# Patient Record
Sex: Female | Born: 1985
Health system: Midwestern US, Community
[De-identification: ages and names within clinical notes are randomized; demographics above are authoritative.]

---

## 2017-05-03 DIAGNOSIS — N809 Endometriosis, unspecified: Secondary | ICD-10-CM

## 2017-05-03 HISTORY — DX: Endometriosis, unspecified: N80.9

## 2019-01-16 ENCOUNTER — Encounter: Attending: Gynecologic Oncology

## 2019-01-16 NOTE — Progress Notes (Deleted)
[facility]  [address] [place], [place]  [place], [place]  [place], [place]  [phone] FAX [phone]  Moss Mc DO      Patient ID:  Name:  Lindsay Dawson  MRN:  [id number]  DOB:  March 06, 1986/33 y.o.  Date:  01/15/2019      HISTORY OF PRESENT ILLNESS:  Lindsay Dawson is a 33 y.o. G{NUMBERS 1-10:19289}P{NUMBERS 1-10:19289} {race/ethnicity:17218} {Menopause:31378} female referred by *** for ***.      Labs:  ***      Imaging  ***       ROS:   As above      There are no active problems to display for this patient.    No past medical history on file.   No past surgical history on file.   OB History    No obstetric history on file.       Social History     Tobacco Use   ??? Smoking status: Not on file   Substance Use Topics   ??? Alcohol use: Not on file      No family history on file.   No current outpatient medications on file.     No current facility-administered medications for this visit.      Allergies not on file       OBJECTIVE:    Physical Exam  VITAL SIGNS: There were no vitals taken for this visit.   GENERAL APP: in no apparent distress and well developed and well nourished   MUSCULOSKEL: no joint tenderness, deformity or swelling   INTEGUMENT:  warm and dry, no rashes or lesions   ABDOMEN .soft, NT, ND, No masses appreciated   EXTREMITIES: extremities normal, atraumatic, no cyanosis or edema   PELVIC: {Exam; pelvic:30843}   RECTAL: deferred   NODAL SURVEY: Cervical, supraclavicular, axillary and inguinal nodes normal.   NEURO: Grossly normal         IMPRESSION/PLAN:      The total time spent was *** minutes regarding this patients diagnosis of *** and >50% of this time was spent counseling and coordinating care    [facility]  9/14/20207:39 AM

## 2020-05-27 ENCOUNTER — Encounter: Payer: Self-pay | Admitting: Advanced Practice Midwife

## 2020-08-08 LAB — OB RESULTS CONSOLE RPR: RPR: NONREACTIVE

## 2020-08-08 LAB — OB RESULTS CONSOLE VARICELLA ZOSTER ANTIBODY, IGG: Varicella: IMMUNE

## 2020-08-08 LAB — OB RESULTS CONSOLE GC/CHLAMYDIA
Chlamydia: NEGATIVE
Gonorrhea: NEGATIVE

## 2020-08-08 LAB — OB RESULTS CONSOLE HEPATITIS B SURFACE ANTIGEN: Hepatitis B Surface Ag: NEGATIVE

## 2020-08-08 LAB — OB RESULTS CONSOLE HIV ANTIBODY (ROUTINE TESTING): HIV: NONREACTIVE

## 2020-08-08 LAB — OB RESULTS CONSOLE RUBELLA ANTIBODY, IGM: Rubella: IMMUNE

## 2020-10-27 ENCOUNTER — Other Ambulatory Visit: Payer: Self-pay | Admitting: Obstetrics & Gynecology

## 2020-10-29 ENCOUNTER — Other Ambulatory Visit: Payer: Self-pay | Admitting: Obstetrics & Gynecology

## 2020-10-29 DIAGNOSIS — K8020 Calculus of gallbladder without cholecystitis without obstruction: Secondary | ICD-10-CM

## 2020-11-05 ENCOUNTER — Ambulatory Visit
Admission: RE | Admit: 2020-11-05 | Discharge: 2020-11-05 | Disposition: A | Discharge status: Home / Self Care | Payer: BC Managed Care – PPO | Source: Ambulatory Visit | Attending: Obstetrics & Gynecology | Admitting: Obstetrics & Gynecology

## 2020-11-05 DIAGNOSIS — K8020 Calculus of gallbladder without cholecystitis without obstruction: Secondary | ICD-10-CM

## 2020-12-18 ENCOUNTER — Ambulatory Visit: Payer: BC Managed Care – PPO | Admitting: Registered"

## 2020-12-18 ENCOUNTER — Other Ambulatory Visit: Payer: Self-pay

## 2020-12-18 ENCOUNTER — Encounter: Payer: BC Managed Care – PPO | Attending: Obstetrics & Gynecology | Admitting: Registered"

## 2020-12-18 DIAGNOSIS — Z3A00 Weeks of gestation of pregnancy not specified: Secondary | ICD-10-CM | POA: Diagnosis not present

## 2020-12-18 DIAGNOSIS — O99810 Abnormal glucose complicating pregnancy: Secondary | ICD-10-CM | POA: Insufficient documentation

## 2020-12-18 DIAGNOSIS — O24419 Gestational diabetes mellitus in pregnancy, unspecified control: Secondary | ICD-10-CM

## 2020-12-18 NOTE — Progress Notes (Signed)
Patient was seen for Gestational Diabetes self-management on 12/18/2020  Start time 0830 and End time 0915   Estimated due date: 02/27/2021; [redacted]w[redacted]d  Clinical: Medications: none Supplements: CoQ10, Iron, prenatal vitamin, B6 Medical History: no previous GDM Labs: OGTT elevated, A1c unknown  Dietary and Lifestyle History: Patient states she wakes up around 8:30 and eats a little food, but no carbs and around 10 am her husband makes breakfast for her that she will eat while working and make take an hour to finish her meal. Pt reports lunch usually just consists of rice and vegetables during the week.   Physical Activity: 5x/week, 1 1/2 - 2 hrs Stress: not assessed Sleep: "plenty"  24 hr Recall: First Meal: eggs, carrots, nuts then 90 min later cereal, milk, apple Snack: Second meal: rice, vegetables, meat on weekends Snack: 2 clementine oranges Third meal: chipate bread, vegetables, egg, 8 oz buttermilk Snack: Beverages: water >100 oz  NUTRITION INTERVENTION  Nutrition education (E-1) on the following topics:   Initial Follow-up  [x]  []  Definition of Gestational Diabetes [x]  []  Why dietary management is important in controlling blood glucose [x]  []  Effects each nutrient has on blood glucose levels [x]  []  Simple carbohydrates vs complex carbohydrates [x]  []  Fluid intake [x]  []  Creating a balanced meal plan [x]  []  Carbohydrate counting  [x]  []  When to check blood glucose levels [x]  []  Proper blood glucose monitoring techniques [x]  []  Effect of stress and stress reduction techniques  [x]  []  Exercise effect on blood glucose levels, appropriate exercise during pregnancy []  []  Importance of limiting caffeine and abstaining from alcohol and smoking []  []  Medications used for blood sugar control during pregnancy []  []  Hypoglycemia and rule of 15 [x]  []  Postpartum self care  Patient already has a meter, is testing pre breakfast and 2 hours after each meal.  Patient instructed to  monitor glucose levels: FBS: 60 - ? 95 mg/dL (some clinics use 90 for cutoff) 1 hour: ? 140 mg/dL 2 hour: ? mg/dL  Patient received handouts: Nutrition Diabetes and Pregnancy Carbohydrate Counting List  Patient will be seen for follow-up as needed.

## 2020-12-22 DIAGNOSIS — O24419 Gestational diabetes mellitus in pregnancy, unspecified control: Secondary | ICD-10-CM | POA: Insufficient documentation

## 2021-02-04 LAB — OB RESULTS CONSOLE GBS: GBS: NEGATIVE

## 2021-02-17 ENCOUNTER — Encounter (HOSPITAL_COMMUNITY): Payer: Self-pay | Admitting: *Deleted

## 2021-02-18 ENCOUNTER — Other Ambulatory Visit: Payer: Self-pay | Admitting: Obstetrics & Gynecology

## 2021-02-18 NOTE — H&P (Addendum)
Paula Herring is a 35 y.o. 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  28 wk Glucola 236- A2GDM Insulin Lantus added at 31 wks due to fasting in 110s and PP 160-200s, added Glyburide w/ lunch. Diet indiscretion and limited exercise  Growth at 36 wks- LGA baby Vx, AFI 19 cm, BPP 8/8 , 7'9" 90%, AC 93% BPD 93%   Severe PUPPP at 32 wks, needing steroid pack at around 34 wks., failed other treatment.   OB History     Gravida  1   Para      Term      Preterm      AB      Living         SAB      IAB      Ectopic      Multiple      Live Births             No past medical history on file.  Family History: family history is not on file. Social History:  has no history on file for tobacco use, alcohol use, and drug use.     Maternal Diabetes: Yes:  Diabetes Type:  Insulin/Medication controlled Genetic Screening: Normal NIPT and AFP1 Maternal Ultrasounds/Referrals: Normal Fetal Ultrasounds or other Referrals:  None Maternal Substance Abuse:  No Significant Maternal Medications:  Meds include: Other:  Lantus insulin, Glyburide added at 34 wks. Prednisone 10 day taper for severe PUPPP Significant Maternal Lab Results:  Group B Strep negative Other Comments:  None  Review of Systems History   There were no vitals taken for this visit. Exam Physical Exam  Physical exam:  A&O x 3, no acute distress. Pleasant Generalized rash but healing from severe PUPPP HEENT neg, no thyromegaly Lungs CTA bilat CV RRR, S1S2 normal Abdo soft, non tender, non acute Extr no edema/ tenderness Pelvic per RN FHT  130s + accels no decels mod variability- cat I  Toco q 5-6 min  Prenatal labs: ABO, Rh:  B+ Antibody:  Neg Rubella: Immune (04/08 0000) RPR: Nonreactive (04/08 0000)  HBsAg: Negative (04/08 0000)  HIV: Non-reactive (04/08 0000)  GBS:  Negative/-- (10/05 0000)  NIPT nl AFP1 nl Glucola 236  Assessment/Plan: 35 yo G1 at 39 wks for IOL for A2GDM w/ poor control w/ macrosomia  High unengaged station, borderline pelvis Plan Cytotec- PO okay since vag exam may be difficult, #2 cytotec placed at 5.30 AM EFW 8.1/2 lbs, MOD guarded  A2GDM BS q 4 now and q 2 hrs in active labor   Spoke w/ RN- saline lock, ambulate, regular diet, shower and reassess at 9.30 am for pitocin. CNM may attempt cervical balloon.   Robley Fries

## 2021-02-19 ENCOUNTER — Encounter (HOSPITAL_COMMUNITY): Payer: Self-pay | Admitting: Obstetrics & Gynecology

## 2021-02-19 ENCOUNTER — Inpatient Hospital Stay (HOSPITAL_COMMUNITY): Payer: BC Managed Care – PPO | Admitting: Anesthesiology

## 2021-02-19 ENCOUNTER — Inpatient Hospital Stay (HOSPITAL_COMMUNITY)
Admission: AD | Admit: 2021-02-19 | Discharge: 2021-02-22 | DRG: 788 | Disposition: A | Discharge status: Home / Self Care | Payer: BC Managed Care – PPO | Attending: Obstetrics & Gynecology | Admitting: Obstetrics & Gynecology

## 2021-02-19 ENCOUNTER — Inpatient Hospital Stay (HOSPITAL_COMMUNITY): Payer: BC Managed Care – PPO

## 2021-02-19 ENCOUNTER — Other Ambulatory Visit: Payer: Self-pay

## 2021-02-19 DIAGNOSIS — O99892 Other specified diseases and conditions complicating childbirth: Secondary | ICD-10-CM | POA: Diagnosis present

## 2021-02-19 DIAGNOSIS — Z20822 Contact with and (suspected) exposure to covid-19: Secondary | ICD-10-CM | POA: Diagnosis present

## 2021-02-19 DIAGNOSIS — O3663X0 Maternal care for excessive fetal growth, third trimester, not applicable or unspecified: Secondary | ICD-10-CM | POA: Diagnosis present

## 2021-02-19 DIAGNOSIS — O2686 Pruritic urticarial papules and plaques of pregnancy (PUPPP): Secondary | ICD-10-CM | POA: Diagnosis present

## 2021-02-19 DIAGNOSIS — O339 Maternal care for disproportion, unspecified: Secondary | ICD-10-CM | POA: Diagnosis present

## 2021-02-19 DIAGNOSIS — Z3A39 39 weeks gestation of pregnancy: Secondary | ICD-10-CM

## 2021-02-19 DIAGNOSIS — Z3490 Encounter for supervision of normal pregnancy, unspecified, unspecified trimester: Secondary | ICD-10-CM | POA: Diagnosis present

## 2021-02-19 DIAGNOSIS — O24424 Gestational diabetes mellitus in childbirth, insulin controlled: Principal | ICD-10-CM | POA: Diagnosis present

## 2021-02-19 DIAGNOSIS — O24419 Gestational diabetes mellitus in pregnancy, unspecified control: Secondary | ICD-10-CM

## 2021-02-19 LAB — CBC
HCT: 37.1 % (ref 36.0–46.0)
Hemoglobin: 12.2 g/dL (ref 12.0–15.0)
MCH: 28.4 pg (ref 26.0–34.0)
MCHC: 32.9 g/dL (ref 30.0–36.0)
MCV: 86.5 fL (ref 80.0–100.0)
Platelets: 217 10*3/uL (ref 150–400)
RBC: 4.29 MIL/uL (ref 3.87–5.11)
RDW: 15.4 % (ref 11.5–15.5)
WBC: 8.5 10*3/uL (ref 4.0–10.5)
nRBC: 0 % (ref 0.0–0.2)

## 2021-02-19 LAB — GLUCOSE, CAPILLARY
Glucose-Capillary: 109 mg/dL — ABNORMAL HIGH (ref 70–99)
Glucose-Capillary: 87 mg/dL (ref 70–99)
Glucose-Capillary: 118 mg/dL — ABNORMAL HIGH (ref 70–99)
Glucose-Capillary: 72 mg/dL (ref 70–99)
Glucose-Capillary: 74 mg/dL (ref 70–99)
Glucose-Capillary: 81 mg/dL (ref 70–99)

## 2021-02-19 LAB — TYPE AND SCREEN
ABO/RH(D): B POS
Antibody Screen: NEGATIVE

## 2021-02-19 LAB — RPR: RPR Ser Ql: NONREACTIVE

## 2021-02-19 LAB — RESP PANEL BY RT-PCR (FLU A&B, COVID) ARPGX2
Influenza A by PCR: NEGATIVE
Influenza B by PCR: NEGATIVE
SARS Coronavirus 2 by RT PCR: NEGATIVE

## 2021-02-19 MED ORDER — MISOPROSTOL 25 MCG QUARTER TABLET
25.0000 ug | ORAL_TABLET | ORAL | Status: DC | PRN
  Administered 2021-02-19 (×2): 25 ug via VAGINAL
  Filled 2021-02-19 (×2): qty 1

## 2021-02-19 MED ORDER — LACTATED RINGERS IV SOLN
500.0000 mL | Freq: Once | INTRAVENOUS | Status: AC
  Administered 2021-02-19: 500 mL via INTRAVENOUS

## 2021-02-19 MED ORDER — FENTANYL CITRATE (PF) 100 MCG/2ML IJ SOLN
100.0000 ug | INTRAMUSCULAR | Status: DC | PRN
  Administered 2021-02-19 (×2): 100 ug via INTRAVENOUS
  Filled 2021-02-19 (×2): qty 2

## 2021-02-19 MED ORDER — TERBUTALINE SULFATE 1 MG/ML IJ SOLN: 0.2500 mg | Freq: Once | INTRAMUSCULAR | Status: DC | PRN

## 2021-02-19 MED ORDER — EPHEDRINE 5 MG/ML INJ: 10.0000 mg | INTRAVENOUS | Status: DC | PRN

## 2021-02-19 MED ORDER — OXYTOCIN-SODIUM CHLORIDE 30-0.9 UT/500ML-% IV SOLN
1.0000 m[IU]/min | INTRAVENOUS | Status: DC
Start: 2021-02-19 — End: 2021-02-20
  Administered 2021-02-19: 2 m[IU]/min via INTRAVENOUS
  Filled 2021-02-19: qty 500

## 2021-02-19 MED ORDER — LACTATED RINGERS IV SOLN: 500.0000 mL | INTRAVENOUS | Status: DC | PRN

## 2021-02-19 MED ORDER — LIDOCAINE HCL (PF) 1 % IJ SOLN: 30.0000 mL | INTRAMUSCULAR | Status: DC | PRN

## 2021-02-19 MED ORDER — LIDOCAINE HCL (PF) 1 % IJ SOLN
INTRAMUSCULAR | Status: DC | PRN
  Administered 2021-02-19: 2 mL via EPIDURAL
  Administered 2021-02-19: 5 mL via EPIDURAL
  Administered 2021-02-19: 3 mL via EPIDURAL

## 2021-02-19 MED ORDER — LACTATED RINGERS IV SOLN: 500.0000 mL | Freq: Once | INTRAVENOUS | Status: DC

## 2021-02-19 MED ORDER — ONDANSETRON HCL 4 MG/2ML IJ SOLN: 4.0000 mg | Freq: Four times a day (QID) | INTRAMUSCULAR | Status: DC | PRN

## 2021-02-19 MED ORDER — ACETAMINOPHEN 325 MG PO TABS: 650.0000 mg | ORAL_TABLET | ORAL | Status: DC | PRN

## 2021-02-19 MED ORDER — DIPHENHYDRAMINE HCL 50 MG/ML IJ SOLN: 12.5000 mg | INTRAMUSCULAR | Status: DC | PRN

## 2021-02-19 MED ORDER — OXYTOCIN BOLUS FROM INFUSION: 333.0000 mL | Freq: Once | INTRAVENOUS | Status: DC

## 2021-02-19 MED ORDER — LACTATED RINGERS IV SOLN: INTRAVENOUS | Status: DC

## 2021-02-19 MED ORDER — FENTANYL-BUPIVACAINE-NACL 0.5-0.125-0.9 MG/250ML-% EP SOLN
12.0000 mL/h | EPIDURAL | Status: DC | PRN
  Administered 2021-02-19: 12 mL/h via EPIDURAL
  Filled 2021-02-19: qty 250

## 2021-02-19 MED ORDER — PHENYLEPHRINE 40 MCG/ML (10ML) SYRINGE FOR IV PUSH (FOR BLOOD PRESSURE SUPPORT): 80.0000 ug | PREFILLED_SYRINGE | INTRAVENOUS | Status: DC | PRN

## 2021-02-19 MED ORDER — PHENYLEPHRINE 40 MCG/ML (10ML) SYRINGE FOR IV PUSH (FOR BLOOD PRESSURE SUPPORT)
80.0000 ug | PREFILLED_SYRINGE | INTRAVENOUS | Status: DC | PRN
  Filled 2021-02-19: qty 10

## 2021-02-19 MED ORDER — SOD CITRATE-CITRIC ACID 500-334 MG/5ML PO SOLN
30.0000 mL | ORAL | Status: DC | PRN
  Administered 2021-02-20: 30 mL via ORAL
  Filled 2021-02-19: qty 30

## 2021-02-19 MED ORDER — OXYTOCIN-SODIUM CHLORIDE 30-0.9 UT/500ML-% IV SOLN: 2.5000 [IU]/h | INTRAVENOUS | Status: DC

## 2021-02-19 NOTE — Progress Notes (Signed)
Paula Herring is a 35 y.o. G1P0 at [redacted]w[redacted]d by ultrasound admitted for induction of labor due to Gestational diabetes-poor control. Severe PUPPP in pregnancy needing oral steroid taper   Subjective: Epidural working well   Objective: BP (!) 89/71   Pulse 94   Temp 97.9 F (36.6 C) (Oral)   Resp 18   Ht 5\' 6"  (1.676 m)   Wt 90 kg   SpO2 99%   BMI 32.04 kg/m  FHR: 140 bpm, variability: moderate,  accelerations:  Present,  decelerations:  Absent UC:   regular, every 3 minutes SVE:   Dilation: 4.5 Effacement (%): 100 Station: -3 Exam by:: Dr. [email or web address] Small caput noted  Assessment / Plan: Induction of labor due to gestational diabetes,  progressing well on pitocin  Labor: Progressing normally but no descent. S/p Cytotec x 2, then cervical balloon, AROM and now on pitocin Preeclampsia:   NA Fetal Wellbeing:  Category I Pain Control:  Epidural I/D:  n/a Anticipated MOD:   Guarded. Continue to turn pt and assess if aids descent. High stn and borderline pelvis are of concern and risk fo C/s   A2GDM- Lantus last night, good control at present PUPPP stable, anticipate improvement pp   Juliene Pina 02/19/2021, 9:00 PM

## 2021-02-19 NOTE — Progress Notes (Signed)
RN entered patient's room to apply FHR monitors per intermittent monitoring order. Patient in the shower. Patient told to call RN when out of the shower so FHR monitors could be applied.

## 2021-02-19 NOTE — Anesthesia Preprocedure Evaluation (Addendum)
Anesthesia Evaluation  Patient identified by MRN, date of birth, ID band Patient awake    Reviewed: Allergy & Precautions, Patient's Chart, lab work & pertinent test results  Airway Mallampati: II  TM Distance: >3 FB Neck ROM: Full    Dental   Pulmonary neg pulmonary ROS,    breath sounds clear to auscultation       Cardiovascular negative cardio ROS   Rhythm:Regular Rate:Normal     Neuro/Psych negative neurological ROS     GI/Hepatic negative GI ROS, Neg liver ROS,   Endo/Other  diabetes, Gestational, Insulin Dependent  Renal/GU negative Renal ROS     Musculoskeletal   Abdominal   Peds  Hematology negative hematology ROS (+)   Anesthesia Other Findings   Reproductive/Obstetrics (+) Pregnancy                             Lab Results  Component Value Date   WBC 8.5 02/19/2021   HGB 12.2 02/19/2021   HCT 37.1 02/19/2021   MCV 86.5 02/19/2021   PLT 217 02/19/2021   No results found for: CREATININE, BUN, NA, K, CL, CO2  Anesthesia Physical Anesthesia Plan  ASA: 2  Anesthesia Plan: Epidural   Post-op Pain Management:    Induction:   PONV Risk Score and Plan: 2 and Treatment may vary due to age or medical condition  Airway Management Planned: Natural Airway  Additional Equipment:   Intra-op Plan:   Post-operative Plan:   Informed Consent: I have reviewed the patients History and Physical, chart, labs and discussed the procedure including the risks, benefits and alternatives for the proposed anesthesia with the patient or authorized representative who has indicated his/her understanding and acceptance.       Plan Discussed with:   Anesthesia Plan Comments: (UPDATE 02/20/21 1:22 AM: Patient to go to OR for C section due to arrest of descent. Labor epidural in place and functioning well. Will plan to use epidural for surgical anesthesia. Discussed with patient. )        Anesthesia Quick Evaluation

## 2021-02-19 NOTE — Progress Notes (Signed)
Paula Herring is a 35 y.o. G1P0 at [redacted]w[redacted]d by ultrasound admitted for  IOL / GDM A2, macrosomia  Subjective: MD request for cervical balloon placement, s/p vaginal cytotec overnight for ripening.  Patient walking in room, spouse attentive at [facility]. R/B of procedure discussed and patient consents.   Objective: Vitals:   02/19/21 0028 02/19/21 0123 02/19/21 0530 02/19/21 0731  BP: 102/60 112/65 112/66 (!) 86/50  Pulse: 82 76 60 (!) 57  Resp: 16 19 17 16   Temp: (!) 97.4 F (36.3 C)  98.2 F (36.8 C) 98.3 F (36.8 C)  TempSrc: Oral  Oral Oral  Weight: 90 kg     Height: 5\' 6"  (1.676 m)         FHT:  FHR: 130 bpm, variability: moderate,  accelerations:  Present,  decelerations:  Absent UC:   regular, every 2-4 minutes SVE:   Dilation: 2.5 Effacement (%): 20 Station: -3 Exam by:: D. Renisha Cockrum CNM Vertex, IBOW Cervical balloon placed without difficulty, patient coping well with procedure Balloon inflated with 60 cc fluid and traction to leg applied.   Labs:   Recent Labs    02/19/21 0037  WBC 8.5  HGB 12.2  HCT 37.1  PLT 217  CBG (last 3)  Recent Labs    02/19/21 0152 02/19/21 0534  GLUCAP 109* 87     Assessment / Plan: G1P0 35 y.o. [redacted]w[redacted]d G1P0 at [redacted]w[redacted]d for IOL GDM A2 - stable CBG in labor Macrosomia  Labor:  S/P vaginal cytotec x 2, now with IF placed Preeclampsia:  no signs or symptoms of toxicity Fetal Wellbeing:   Category I Pain Control:  Labor support without medications I/D:   GBS neg Anticipated  MOD:   guarded for borderline pelvis and fetal macrosomia/diabetic  Dr. Juliene Pina updated w/ patient status  Paula Herring, CNM, MSN 02/19/2021, 8:48 AM

## 2021-02-19 NOTE — Progress Notes (Signed)
Paula Herring is a 35 y.o. G1P0 at [redacted]w[redacted]d by ultrasound admitted for induction of labor due to Gestational diabetes-poor control. Severe PUPPP in pregnancy needing oral steroid taper   Subjective: Epidural working well   Objective: BP (!) 100/59   Pulse 70   Temp 98 F (36.7 C) (Oral)   Resp 17   Ht 5\' 6"  (1.676 m)   Wt 90 kg   SpO2 99%   BMI 32.04 kg/m  Abdo exam note head in almost left suprapubic area  FHT:  FHR: 140 bpm, variability: moderate,  accelerations:  Present,  decelerations:  Absent UC:   regular, every 3 minutes SVE:   Dilation: 5 Effacement (%): 80 Station:  (-4 per Dr. ) Exam by:: Dr. [email or web address] Controlled AROM with RN giving fundal pressure. Clear AF. Head towards pt's left upper pelvis above the inlet, not in pelvis but applied to cervix. No cord palpated   Labs: Lab Results  Component Value Date   WBC 8.5 02/19/2021   HGB 12.2 02/19/2021   HCT 37.1 02/19/2021   MCV 86.5 02/19/2021   PLT 217 02/19/2021    Assessment / Plan: Induction of labor due to gestational diabetes,  progressing well on pitocin  Labor: Progressing normally but no descent. S/p Cytotec x 2, then cervical balloon, contracting well, now s/p AROM Preeclampsia:   NA Fetal Wellbeing:  Category I Pain Control:  Epidural I/D:  n/a Anticipated MOD:   Guarded. Exaggerated Sims, see if head engages, otherwise will need C/s, pt aware and accepts   A2GDM- Lantus last night, good control at present PUPPP stable, anticipate improvement pp   02/21/2021 02/19/2021, 2:48 PM

## 2021-02-19 NOTE — Anesthesia Procedure Notes (Signed)
Epidural Patient location during procedure: OB Start time: 02/19/2021 10:08 AM End time: 02/19/2021 10:15 AM  Staffing Anesthesiologist: Marcene Duos, MD Performed: anesthesiologist   Preanesthetic Checklist Completed: patient identified, IV checked, site marked, risks and benefits discussed, surgical consent, monitors and equipment checked, pre-op evaluation and timeout performed  Epidural Patient position: sitting Prep: DuraPrep and site prepped and draped Patient monitoring: continuous pulse ox and blood pressure Approach: midline Location: L3-L4 Injection technique: LOR air  Needle:  Needle type: Tuohy  Needle gauge: 17 G Needle length: 9 cm and 9 Needle insertion depth: 6 cm Catheter type: closed end flexible Catheter size: 19 Gauge Catheter at skin depth: 11 cm Test dose: negative  Assessment Events: blood not aspirated, injection not painful, no injection resistance, no paresthesia and negative IV test

## 2021-02-20 ENCOUNTER — Encounter (HOSPITAL_COMMUNITY)
Admission: AD | Disposition: A | Discharge status: Home / Self Care | Payer: Self-pay | Source: Home / Self Care | Attending: Obstetrics & Gynecology

## 2021-02-20 ENCOUNTER — Encounter (HOSPITAL_COMMUNITY): Payer: Self-pay | Admitting: Obstetrics & Gynecology

## 2021-02-20 DIAGNOSIS — O2686 Pruritic urticarial papules and plaques of pregnancy (PUPPP): Secondary | ICD-10-CM

## 2021-02-20 DIAGNOSIS — O99892 Other specified diseases and conditions complicating childbirth: Secondary | ICD-10-CM | POA: Diagnosis present

## 2021-02-20 HISTORY — DX: Pruritic urticarial papules and plaques of pregnancy (puppp): O26.86

## 2021-02-20 LAB — GLUCOSE, CAPILLARY
Glucose-Capillary: 108 mg/dL — ABNORMAL HIGH (ref 70–99)
Glucose-Capillary: 142 mg/dL — ABNORMAL HIGH (ref 70–99)
Glucose-Capillary: 155 mg/dL — ABNORMAL HIGH (ref 70–99)

## 2021-02-20 LAB — CBC
HCT: 35.7 % — ABNORMAL LOW (ref 36.0–46.0)
Hemoglobin: 11.9 g/dL — ABNORMAL LOW (ref 12.0–15.0)
MCH: 28.7 pg (ref 26.0–34.0)
MCHC: 33.3 g/dL (ref 30.0–36.0)
MCV: 86.2 fL (ref 80.0–100.0)
Platelets: 183 10*3/uL (ref 150–400)
RBC: 4.14 MIL/uL (ref 3.87–5.11)
RDW: 15.4 % (ref 11.5–15.5)
WBC: 16.5 10*3/uL — ABNORMAL HIGH (ref 4.0–10.5)
nRBC: 0 % (ref 0.0–0.2)

## 2021-02-20 SURGERY — Surgical Case
Anesthesia: Epidural

## 2021-02-20 MED ORDER — FENTANYL CITRATE (PF) 100 MCG/2ML IJ SOLN
INTRAMUSCULAR | Status: DC | PRN
  Administered 2021-02-20: 100 ug via EPIDURAL

## 2021-02-20 MED ORDER — NALOXONE HCL 4 MG/10ML IJ SOLN
1.0000 ug/kg/h | INTRAVENOUS | Status: DC | PRN
  Filled 2021-02-20: qty 5

## 2021-02-20 MED ORDER — KETOROLAC TROMETHAMINE 30 MG/ML IJ SOLN
30.0000 mg | Freq: Once | INTRAMUSCULAR | Status: AC
  Administered 2021-02-20: 30 mg via INTRAVENOUS

## 2021-02-20 MED ORDER — WITCH HAZEL-GLYCERIN EX PADS: 1.0000 "application " | MEDICATED_PAD | CUTANEOUS | Status: DC | PRN

## 2021-02-20 MED ORDER — NALBUPHINE HCL 10 MG/ML IJ SOLN: 5.0000 mg | INTRAMUSCULAR | Status: DC | PRN

## 2021-02-20 MED ORDER — MENTHOL 3 MG MT LOZG: 1.0000 | LOZENGE | OROMUCOSAL | Status: DC | PRN

## 2021-02-20 MED ORDER — KETOROLAC TROMETHAMINE 30 MG/ML IJ SOLN
INTRAMUSCULAR | Status: AC
  Filled 2021-02-20: qty 1

## 2021-02-20 MED ORDER — PRENATAL MULTIVITAMIN CH
1.0000 | ORAL_TABLET | Freq: Every day | ORAL | Status: DC
  Administered 2021-02-20 – 2021-02-22 (×3): 1 via ORAL
  Filled 2021-02-20 (×3): qty 1

## 2021-02-20 MED ORDER — CEFAZOLIN SODIUM-DEXTROSE 2-4 GM/100ML-% IV SOLN
INTRAVENOUS | Status: AC
  Filled 2021-02-20: qty 100

## 2021-02-20 MED ORDER — KETOROLAC TROMETHAMINE 30 MG/ML IJ SOLN: 30.0000 mg | Freq: Four times a day (QID) | INTRAMUSCULAR | Status: AC | PRN

## 2021-02-20 MED ORDER — LIDOCAINE-EPINEPHRINE (PF) 2 %-1:200000 IJ SOLN
INTRAMUSCULAR | Status: DC | PRN
  Administered 2021-02-20: 2 mL via EPIDURAL
  Administered 2021-02-20: 5 mL via EPIDURAL

## 2021-02-20 MED ORDER — OXYTOCIN-SODIUM CHLORIDE 30-0.9 UT/500ML-% IV SOLN
INTRAVENOUS | Status: AC
  Filled 2021-02-20: qty 500

## 2021-02-20 MED ORDER — COCONUT OIL OIL: 1.0000 "application " | TOPICAL_OIL | Status: DC | PRN

## 2021-02-20 MED ORDER — OXYCODONE HCL 5 MG PO TABS
5.0000 mg | ORAL_TABLET | Freq: Four times a day (QID) | ORAL | Status: DC | PRN
  Administered 2021-02-21 – 2021-02-22 (×4): 5 mg via ORAL
  Filled 2021-02-20 (×5): qty 1

## 2021-02-20 MED ORDER — LACTATED RINGERS IV SOLN: INTRAVENOUS | Status: DC | PRN

## 2021-02-20 MED ORDER — ZOLPIDEM TARTRATE 5 MG PO TABS: 5.0000 mg | ORAL_TABLET | Freq: Every evening | ORAL | Status: DC | PRN

## 2021-02-20 MED ORDER — NALBUPHINE HCL 10 MG/ML IJ SOLN: 5.0000 mg | Freq: Once | INTRAMUSCULAR | Status: DC | PRN

## 2021-02-20 MED ORDER — TETANUS-DIPHTH-ACELL PERTUSSIS 5-2.5-18.5 LF-MCG/0.5 IM SUSY: 0.5000 mL | PREFILLED_SYRINGE | Freq: Once | INTRAMUSCULAR | Status: DC

## 2021-02-20 MED ORDER — DEXAMETHASONE SODIUM PHOSPHATE 10 MG/ML IJ SOLN
INTRAMUSCULAR | Status: AC
  Filled 2021-02-20: qty 1

## 2021-02-20 MED ORDER — NALBUPHINE HCL 10 MG/ML IJ SOLN
5.0000 mg | INTRAMUSCULAR | Status: DC | PRN
Start: 2021-02-20 — End: 2021-02-22

## 2021-02-20 MED ORDER — SIMETHICONE 80 MG PO CHEW
80.0000 mg | CHEWABLE_TABLET | Freq: Three times a day (TID) | ORAL | Status: DC
  Administered 2021-02-20 – 2021-02-22 (×6): 80 mg via ORAL
  Filled 2021-02-20 (×6): qty 1

## 2021-02-20 MED ORDER — ACETAMINOPHEN 325 MG PO TABS
650.0000 mg | ORAL_TABLET | ORAL | Status: DC | PRN
  Administered 2021-02-20 – 2021-02-21 (×3): 650 mg via ORAL
  Filled 2021-02-20 (×3): qty 2

## 2021-02-20 MED ORDER — POVIDONE-IODINE 10 % EX SWAB: 2.0000 "application " | Freq: Once | CUTANEOUS | Status: DC

## 2021-02-20 MED ORDER — SODIUM CHLORIDE 0.9 % IV SOLN
INTRAVENOUS | Status: AC
  Filled 2021-02-20: qty 500

## 2021-02-20 MED ORDER — ONDANSETRON HCL 4 MG/2ML IJ SOLN
INTRAMUSCULAR | Status: AC
  Filled 2021-02-20: qty 2

## 2021-02-20 MED ORDER — INFLUENZA VAC SPLIT QUAD 0.5 ML IM SUSY: 0.5000 mL | PREFILLED_SYRINGE | INTRAMUSCULAR | Status: DC

## 2021-02-20 MED ORDER — OXYTOCIN-SODIUM CHLORIDE 30-0.9 UT/500ML-% IV SOLN: 2.5000 [IU]/h | INTRAVENOUS | Status: AC

## 2021-02-20 MED ORDER — MORPHINE SULFATE (PF) 0.5 MG/ML IJ SOLN
INTRAMUSCULAR | Status: DC | PRN
  Administered 2021-02-20: 3 mg via EPIDURAL

## 2021-02-20 MED ORDER — DIBUCAINE (PERIANAL) 1 % EX OINT: 1.0000 "application " | TOPICAL_OINTMENT | CUTANEOUS | Status: DC | PRN

## 2021-02-20 MED ORDER — CEFAZOLIN SODIUM-DEXTROSE 2-4 GM/100ML-% IV SOLN
2.0000 g | INTRAVENOUS | Status: AC
  Administered 2021-02-20: 2 g via INTRAVENOUS

## 2021-02-20 MED ORDER — SIMETHICONE 80 MG PO CHEW: 80.0000 mg | CHEWABLE_TABLET | ORAL | Status: DC | PRN

## 2021-02-20 MED ORDER — FENTANYL CITRATE (PF) 100 MCG/2ML IJ SOLN
INTRAMUSCULAR | Status: AC
  Filled 2021-02-20: qty 2

## 2021-02-20 MED ORDER — NALBUPHINE HCL 10 MG/ML IJ SOLN
5.0000 mg | Freq: Once | INTRAMUSCULAR | Status: DC | PRN
Start: 2021-02-20 — End: 2021-02-22

## 2021-02-20 MED ORDER — IBUPROFEN 600 MG PO TABS
600.0000 mg | ORAL_TABLET | Freq: Four times a day (QID) | ORAL | Status: DC
  Administered 2021-02-21 – 2021-02-22 (×6): 600 mg via ORAL
  Filled 2021-02-20 (×6): qty 1

## 2021-02-20 MED ORDER — ONDANSETRON HCL 4 MG/2ML IJ SOLN: 4.0000 mg | Freq: Three times a day (TID) | INTRAMUSCULAR | Status: DC | PRN

## 2021-02-20 MED ORDER — NALOXONE HCL 0.4 MG/ML IJ SOLN: 0.4000 mg | INTRAMUSCULAR | Status: DC | PRN

## 2021-02-20 MED ORDER — DIPHENHYDRAMINE HCL 50 MG/ML IJ SOLN: 12.5000 mg | INTRAMUSCULAR | Status: DC | PRN

## 2021-02-20 MED ORDER — MORPHINE SULFATE (PF) 0.5 MG/ML IJ SOLN
INTRAMUSCULAR | Status: AC
  Filled 2021-02-20: qty 10

## 2021-02-20 MED ORDER — SODIUM CHLORIDE 0.9% FLUSH: 3.0000 mL | INTRAVENOUS | Status: DC | PRN

## 2021-02-20 MED ORDER — DIPHENHYDRAMINE HCL 25 MG PO CAPS: 25.0000 mg | ORAL_CAPSULE | ORAL | Status: DC | PRN

## 2021-02-20 MED ORDER — OXYTOCIN-SODIUM CHLORIDE 30-0.9 UT/500ML-% IV SOLN
INTRAVENOUS | Status: DC | PRN
  Administered 2021-02-20: 30 [IU] via INTRAVENOUS

## 2021-02-20 MED ORDER — STERILE WATER FOR IRRIGATION IR SOLN
Status: DC | PRN
  Administered 2021-02-20: 1000 mL

## 2021-02-20 MED ORDER — SODIUM CHLORIDE 0.9 % IR SOLN
Status: DC | PRN
  Administered 2021-02-20: 1000 mL

## 2021-02-20 MED ORDER — KETOROLAC TROMETHAMINE 30 MG/ML IJ SOLN
30.0000 mg | Freq: Four times a day (QID) | INTRAMUSCULAR | Status: AC
  Administered 2021-02-20 (×2): 30 mg via INTRAVENOUS
  Filled 2021-02-20 (×3): qty 1

## 2021-02-20 MED ORDER — SCOPOLAMINE 1 MG/3DAYS TD PT72
1.0000 | MEDICATED_PATCH | Freq: Once | TRANSDERMAL | Status: DC
  Administered 2021-02-20: 1.5 mg via TRANSDERMAL
  Filled 2021-02-20: qty 1

## 2021-02-20 MED ORDER — DIPHENHYDRAMINE HCL 25 MG PO CAPS: 25.0000 mg | ORAL_CAPSULE | Freq: Four times a day (QID) | ORAL | Status: DC | PRN

## 2021-02-20 MED ORDER — ONDANSETRON HCL 4 MG/2ML IJ SOLN
INTRAMUSCULAR | Status: DC | PRN
  Administered 2021-02-20: 4 mg via INTRAVENOUS

## 2021-02-20 MED ORDER — SENNOSIDES-DOCUSATE SODIUM 8.6-50 MG PO TABS
2.0000 | ORAL_TABLET | Freq: Every day | ORAL | Status: DC
  Administered 2021-02-21 – 2021-02-22 (×2): 2 via ORAL
  Filled 2021-02-20 (×2): qty 2

## 2021-02-20 SURGICAL SUPPLY — 33 items
BENZOIN TINCTURE PRP APPL 2/3 (GAUZE/BANDAGES/DRESSINGS) IMPLANT
CHLORAPREP W/TINT 26ML (MISCELLANEOUS) ×2 IMPLANT
CLAMP CORD UMBIL (MISCELLANEOUS) IMPLANT
CLOTH BEACON ORANGE TIMEOUT ST (SAFETY) ×2 IMPLANT
DRSG OPSITE POSTOP 4X10 (GAUZE/BANDAGES/DRESSINGS) ×2 IMPLANT
ELECT REM PT RETURN 9FT ADLT (ELECTROSURGICAL) ×2
ELECTRODE REM PT RTRN 9FT ADLT (ELECTROSURGICAL) ×1 IMPLANT
EXTRACTOR VACUUM KIWI (MISCELLANEOUS) IMPLANT
EXTRACTOR VACUUM M CUP 4 TUBE (SUCTIONS) IMPLANT
GLOVE SURG ENC MOIS LTX SZ7 (GLOVE) ×2 IMPLANT
GLOVE SURG UNDER POLY LF SZ7 (GLOVE) ×2 IMPLANT
GOWN STRL REUS W/TWL LRG LVL3 (GOWN DISPOSABLE) ×4 IMPLANT
KIT ABG SYR 3ML LUER SLIP (SYRINGE) IMPLANT
NEEDLE HYPO 25X5/8 SAFETYGLIDE (NEEDLE) IMPLANT
NS IRRIG 1000ML POUR BTL (IV SOLUTION) ×2 IMPLANT
PACK C SECTION WH (CUSTOM PROCEDURE TRAY) ×2 IMPLANT
PAD OB MATERNITY 4.3X12.25 (PERSONAL CARE ITEMS) ×2 IMPLANT
RTRCTR C-SECT PINK 25CM LRG (MISCELLANEOUS) IMPLANT
STRIP CLOSURE SKIN 1/2X4 (GAUZE/BANDAGES/DRESSINGS) IMPLANT
SUT MNCRL 0 VIOLET CTX 36 (SUTURE) ×2 IMPLANT
SUT MONOCRYL 0 CTX 36 (SUTURE) ×2
SUT PLAIN 0 NONE (SUTURE) IMPLANT
SUT PLAIN 2 0 (SUTURE) ×1
SUT PLAIN ABS 2-0 CT1 27XMFL (SUTURE) ×1 IMPLANT
SUT VIC AB 0 CT1 27 (SUTURE) ×2
SUT VIC AB 0 CT1 27XBRD ANBCTR (SUTURE) ×2 IMPLANT
SUT VIC AB 2-0 CT1 27 (SUTURE) ×1
SUT VIC AB 2-0 CT1 TAPERPNT 27 (SUTURE) ×1 IMPLANT
SUT VIC AB 4-0 KS 27 (SUTURE) ×2 IMPLANT
SUT VICRYL 0 TIES 12 18 (SUTURE) IMPLANT
TOWEL OR 17X24 6PK STRL BLUE (TOWEL DISPOSABLE) ×2 IMPLANT
TRAY FOLEY W/BAG SLVR 14FR LF (SET/KITS/TRAYS/PACK) IMPLANT
WATER STERILE IRR 1000ML POUR (IV SOLUTION) ×2 IMPLANT

## 2021-02-20 NOTE — Lactation Note (Signed)
This note was copied from a baby's chart. Lactation Consultation Note  Patient Name: Paula Herring Date: 02/20/2021 Reason for consult: Follow-up assessment;Mother's request;Difficult latch;Primapara;1st time breastfeeding;Term;Breastfeeding assistance;RN request Age:34 hours LC worked with Mom to get a better position to support the latch. Mom really tired during visit, falling asleep during visit.   Mom tried pumping with DEBP to offer more volume since not able to establish latch. She falling asleep again.   DBM consent offered option, to get infant more volume.  Plan 1. To feed based on cues 8-12x 24 hr period. Mom to offer breasts first and look for signs of milk transfer.  2. Mom to offer EBM first followed by [facility] with pace bottle feeding with a slow flow nipple.  3. Mom to pump with dEBP q 3hrs for .   Mom to call for latch assistance with next feeding.  LC shared above findings and plan with RN, Adrienne Mocha  Maternal Data Has patient been taught Hand Expression?: Yes  Feeding Mother's Current Feeding Choice: Breast Milk and Donor Milk  LATCH Score Latch: Repeated attempts needed to sustain latch, nipple held in mouth throughout feeding, stimulation needed to elicit sucking reflex.  Audible Swallowing: None  Type of Nipple: Everted at rest and after stimulation (Infant not able to open wide enough, just sucking on nipple. LC did some suck training to extend tongue. Will come back to work with mom next feeding once she gets some rest.)  Comfort (Breast/Nipple): Soft / non-tender  Hold (Positioning): Assistance needed to correctly position infant at breast and maintain latch.  LATCH Score: 6   Lactation Tools Discussed/Used Tools: Pump;Flanges Flange Size: 21;24 Breast pump type: Double-Electric Breast Pump Pump Education: Setup, frequency, and cleaning;Milk Storage Reason for Pumping: increase stimulation Pumping frequency: every 3 hrs for 15  min  Interventions Interventions: Breast feeding basics reviewed;Support pillows;Education;Assisted with latch;Position options;Pace feeding;Skin to skin;Expressed milk;Breast massage;Hand express;Infant Driven Feeding Algorithm education;Breast compression;Adjust position;DEBP  Discharge Pump: Personal  Consult Status Consult Status: Follow-up Date: 02/21/21 Follow-up type: In-patient    Hollace Michelli  Nicholson-Springer 02/20/2021, 5:55 PM

## 2021-02-20 NOTE — Progress Notes (Signed)
Contact Paula Herring CNM to clarify CBG monitoring orders. Currently ordered for post perianals but no time frame given. Per CNM verbal order will recheck patients CBG after pts breakfast and call CNM with results to determine if further CBG checks are needed moving forward.

## 2021-02-20 NOTE — Progress Notes (Signed)
Complete at 10.45 am and pusing off and on.  SVE stn 0 to +1 with no progressed, only rocking during oushing and regresses back to 0, caput moulding. Inadequate pelvis.  FHT cat II, intermittent variable decels w/ pushing down to 80s and slow recovery.  Recc C/s. Risks/complications of surgery reviewed incl infection, bleeding, damage to internal organs including bladder, bowels, ureters, blood vessels, other risks from anesthesia, VTE and delayed complications of any surgery, complications in future surgery reviewed. Also discussed neonatal complications incl difficult delivery, laceration, vacuum assistance, TTN etc. Pt understands and agrees, all concerns addressed.   Agrees  Shea Evans MD

## 2021-02-20 NOTE — Op Note (Signed)
Cesarean Section Procedure Note  Emmry Hinsch  02/20/2021  Procedure: Primary Low Transverse Cesarean section   Indications:  Arrest of descent   Induction for A2GDM. Suspect macrosomia. Severe PUPPP in third trimester. Arrest of descent in 2nd stage, pushed at 0/+1 station for 1.1/2 hours with no progress, pelvis inadequate.  Pre-operative Diagnosis: Arrest of descent / CPD. A2GDM  Post-operative Diagnosis: Same   Surgeon: Shea Evans, MD  Assistants: Arlan Organ, CNM   Anesthesia: epidural   Procedure Details:  The patient was seen in the Labor Room. The risks, benefits, complications, treatment options, and expected outcomes were discussed with the patient. The patient concurred with the proposed plan, giving informed consent. identified as Wynn Banker and the procedure verified as C-Section Delivery. A Time Out was held and the above information confirmed. 2 gm Ancef given After induction of anesthesia, the patient was draped and prepped in the usual sterile manner, foley was draining urine well.  A pfannenstiel incision was made and carried down through the subcutaneous tissue to the fascia. Fascial incision was made and extended transversely. The fascia was separated from the underlying rectus tissue superiorly and inferiorly. The peritoneum was identified and entered. Peritoneal incision was extended longitudinally. Alexis-O retractor placed. A low transverse uterine incision was made away from bladder reflection. Amniotic fluid was clear. Baby was in LOT position. Delivered from cephalic presentation was a Female infant with vigorous cry. Apgar scores of 9 at one minute and 9 at five minutes. Delayed cord clamping done at 1 minute and baby handed to NICU team in attendance. Cord ph was not sent. Cord blood was obtained for evaluation. The placenta was removed Intact and appeared normal. The uterine outline, tubes and ovaries appeared normal}. The uterine incision was closed with  running locked sutures of . A second imbricating layer sutured.   Hemostasis was observed. Alexis retractor removed. Peritoneal closure done with 2-0 Vicryl.  The fascia was then reapproximated with running sutures of 0Vicryl. The subcuticular closure was performed using 2-0plain gut. The skin was closed with 4-0Vicryl. Honeycomb, steristrips applied.  Instrument, sponge, and needle counts were correct prior the abdominal closure and were correct at the conclusion of the case.   Findings: Female infant delivered cephalic via Kerr hysterotomy from mid-pelvic arrest from LOT position. Apgars 9 and 9, clear amniotic fluid. Normal placenta, loose nuchal cord, normal tubes and ovaries.    Estimated Blood Loss: 316 cc   Total IV Fluids:  LR 1200 ml   Urine Output:  50 CC OF clear urine blood tinge resolved at the end of surgery  Specimens: cord blood    Complications: no complications  Disposition: PACU - hemodynamically stable.   Maternal Condition: stable   Baby condition / location:  Couplet care / Skin to Skin  Attending Attestation: I performed the procedure.   Signed: Surgeon(s): Shea Evans, MD

## 2021-02-20 NOTE — Anesthesia Postprocedure Evaluation (Signed)
Anesthesia Post Note  Patient: Clinical biochemist  Procedure(s) Performed: CESAREAN SECTION     Patient location during evaluation: PACU Anesthesia Type: Epidural Level of consciousness: oriented and awake and alert Pain management: pain level controlled Vital Signs Assessment: post-procedure vital signs reviewed and stable Respiratory status: spontaneous breathing, respiratory function stable and nonlabored ventilation Cardiovascular status: blood pressure returned to baseline and stable Postop Assessment: no headache, no backache, no apparent nausea or vomiting and epidural receding Anesthetic complications: no   No notable events documented.  Last Vitals:  Vitals:   02/20/21 0300 02/20/21 0348  BP: (!) 97/58 104/61  Pulse: 76 80  Resp: 17 18  Temp:  37.3 C  SpO2: 99% 97%    Last Pain:  Vitals:   02/20/21 0348  TempSrc: Oral  PainSc:    Pain Goal:                   Paula Herring

## 2021-02-20 NOTE — Lactation Note (Signed)
This note was copied from a baby's chart. Lactation Consultation Note  Patient Name: Paula Herring [id number] Date: 02/20/2021 Reason for consult: Initial assessment;Term;Primapara;1st time breastfeeding Age:34 hours   P1 mother whose infant is now 80 hours old.  This is a term baby at 39+0 weeks.  Baby asleep STS on mother's chest when I arrived.  Offered to assist with latching and mother agreeable.  Taught hand expression and mother able to easily express colostrum.  Finger fed colostrum to baby.  Assisted to latch easily and observed baby feeding with stimulation for 10 minutes.    Encouraged mother to feed 8-12 times/24 hours or sooner if baby shows feeding cues.  Mother will call for latch assistance as needed.  Father present.  Swaddled baby and placed in bassinet after feeding; family to rest now.   Maternal Data Has patient been taught Hand Expression?: Yes Does the patient have breastfeeding experience prior to this delivery?: No  Feeding Mother's Current Feeding Choice: Breast Milk  LATCH Score Latch: Repeated attempts needed to sustain latch, nipple held in mouth throughout feeding, stimulation needed to elicit sucking reflex.  Audible Swallowing: A few with stimulation  Type of Nipple: Everted at rest and after stimulation  Comfort (Breast/Nipple): Soft / non-tender  Hold (Positioning): Assistance needed to correctly position infant at breast and maintain latch.  LATCH Score: 7   Lactation Tools Discussed/Used    Interventions Interventions: Breast feeding basics reviewed;Assisted with latch;Skin to skin;Breast massage;Hand express;Breast compression;Position options;Support pillows;Adjust position;Education;LC Services brochure  Discharge    Consult Status Consult Status: Follow-up Date: 02/21/21 Follow-up type: In-patient    Ramond Darnell R Naw Lasala 02/20/2021, 4:58 AM

## 2021-02-20 NOTE — Transfer of Care (Signed)
Immediate Anesthesia Transfer of Care Note  Patient: Paula Herring  Procedure(s) Performed: CESAREAN SECTION  Patient Location: PACU  Anesthesia Type:Epidural  Level of Consciousness: awake, alert  and oriented  Airway & Oxygen Therapy: Patient Spontanous Breathing  Post-op Assessment: Report given to RN and Post -op Vital signs reviewed and stable  Post vital signs: Reviewed and stable  Last Vitals:  Vitals Value Taken Time  BP    Temp    Pulse 98 02/20/21 0214  Resp 17 02/20/21 0214  SpO2 99 % 02/20/21 0214  Vitals shown include unvalidated device data.  Last Pain:  Vitals:   02/19/21 2215  TempSrc: Oral  PainSc:          Complications: No notable events documented.

## 2021-02-21 ENCOUNTER — Encounter (HOSPITAL_COMMUNITY): Payer: Self-pay | Admitting: Obstetrics & Gynecology

## 2021-02-21 LAB — GLUCOSE, CAPILLARY: Glucose-Capillary: 101 mg/dL — ABNORMAL HIGH (ref 70–99)

## 2021-02-21 NOTE — Lactation Note (Signed)
This note was copied from a baby's chart. Lactation Consultation Note  Patient Name: Paula Herring [id number] Date: 02/21/2021 Reason for consult: Follow-up assessment;1st time breastfeeding;Primapara;Term;Maternal endocrine disorder Age:34 hours  LC in to visit with [name] and FOB of term baby.  Mom has been bottle feeding baby donor breast milk due to recommendation of LC when baby was 16 hrs old and not consistently feeding.  Mom was set up with a DEBP and after pumping was concerned her colostrum wasn't enough.  Mom opted to supplement with donor breast milk.   Mom has been very tired and in pain from C/S.  Baby had just fed 20 ml donor breast milk by bottle.  Mom is not consistently putting baby to the breast each time.  Asked Mom what her desire is and she responded "to breastfeed".  Spent time reassuring her that baby and her were learning to breast feed.  Talked about importance of consistent pumping if baby is being supplemented with bottles.  Educated Mom that baby breastfeeding is the best way to stimulate her milk supply.  Encouraged Mom to have baby STS on her chest to encouraged baby to want to breastfeed.  Feeding cues reviewed.  Mom to call prn for assistance with latching to the breast.  FOB asked if baby should have formula as they won't be able to go home with donor milk.  RN stated that they will worry about that tomorrow.  LC stated that baby may be breastfeeding and they won't need to supplement.  Paced bottle feeding reviewed and recommended as method of bottle feeding.    Lactation Tools Discussed/Used Tools: Pump;Bottle;Flanges Flange Size: 21;24 Breast pump type: Double-Electric Breast Pump Pump Education: Setup, frequency, and cleaning;Milk Storage Reason for Pumping: Support milk supply/ using donor breast milk Pumping frequency: Not since yesterday, encouraged to pump both breasts every 3 hrs or when baby is being supplemented Pumped volume: 0 mL  (drops)  Interventions Interventions: Breast feeding basics reviewed;Skin to skin;Breast massage;Hand express;DEBP;Pace feeding  Consult Status Consult Status: Follow-up Date: 02/22/21 Follow-up type: In-patient    Judee Clara 02/21/2021, 12:45 PM

## 2021-02-21 NOTE — Progress Notes (Signed)
Subjective: POD# 1 Live born female  Birth Weight: 7 lb 12.9 oz (3541 g) APGAR: 9, 9  Newborn Delivery   Birth date/time: 02/20/2021 01:30:00 Delivery type: C-Section, Low Transverse Trial of labor: Yes C-section categorization: Primary     Baby name: Gardiner Rhyme Delivering provider: MODY, VAISHALI   Feeding: breast and bottle  Pain control at delivery: Epidural   Reports feeling sore but well overall. Father and husband at the bedside. Bottle feeding with donor milk. Pumping some. Bleeding is minimal.  Patient reports tolerating PO.   Pain controlled with acetaminophen and ibuprofen (OTC) Denies HA/SOB/C/P/N/V/dizziness. She reports vaginal bleeding as normal, without clots. She is ambulating and urinating without difficulty.     Objective:  Vitals:   02/20/21 1026 02/20/21 1409 02/20/21 2128 02/21/21 0509  BP:  109/67 (!) 102/54 119/76  Pulse:  79 (!) 110 79  Resp: 16 16 17 19   Temp:   100 F (37.8 C) 97.8 F (36.6 C)  TempSrc:   Oral Oral  SpO2:  98% 97% 99%  Weight:      Height:         Intake/Output Summary (Last 24 hours) at 02/21/2021 1049 Last data filed at 02/20/2021 1400 Gross per 24 hour  Intake --  Output 1275 ml  Net -1275 ml      Recent Labs    02/19/21 0037 02/20/21 0427  WBC 8.5 16.5*  HGB 12.2 11.9*  HCT 37.1 35.7*  PLT 217 183    Blood type: --/--/B POS (10/20 0037)  Rubella: Immune (04/08 0000)   Vaccines:   TDaP          UTD                      COVID-19 UTD  Physical Exam:  General: alert and cooperative CV: Regular rate and rhythm Resp: clear Abdomen: soft, nontender, normal bowel sounds Incision: clean, dry, and intact Uterine Fundus: firm, below umbilicus, nontender Lochia: minimal Ext: extremities normal, atraumatic, no cyanosis or edema and no edema, redness or tenderness in the calves or thighs  Assessment/Plan: 35 y.o.   POD# 1. G1P1001                  Principal Problem:   Postpartum care following cesarean delivery  10/21  Encourage rest when baby rests Breastfeeding support Encourage to ambulate Routine post-op care  Active Problems:   GDM, class A2  Poorly controlled during pregnancy  PP CBGs elevated, fasting this AM 101, PP yesterday 140-150s  Diabetic consult ordered   Encounter for induction of labor   PUPPP (pruritic urticarial papules and plaques of pregnancy)  Resolving  Anticipate continued improvement   Cesarean delivery - arrest of descent  Anticipate discharge home tomorrow.   11/21, CNM, MSN 02/21/2021, 10:49 AM

## 2021-02-21 NOTE — Progress Notes (Addendum)
Inpatient Diabetes Program Recommendations  AACE/ADA: New Consensus Statement on Inpatient Glycemic Control (2015)  Target Ranges:  Prepandial:   less than 140 mg/dL      Peak postprandial:   less than 180 mg/dL (1-2 hours)      Critically ill patients:  140 - 180 mg/dL   Lab Results  Component Value Date   GLUCAP 101 (H) 02/21/2021    Review of Glycemic Control  Diabetes history: gestational diabetes Outpatient Diabetes medications: Lantus and Glyburide Current orders for Inpatient glycemic control: none  Inpatient Diabetes Program Recommendations:   Received diabetes coordinator consult. Noted that patient has delivered by C section. Noted that fasting CBG this am was 101 mg/dl.   Now that patient has delivered, recommend checking blood sugars TID prior to meals. Goal is to keep blood sugars less than 180 mg/dl while in the hospital. If blood sugars begin to increase, may need to start Novolog SENSITIVE (0-9 units) correction scale TID per non-pregnant patient: glycemic control order set. Patient will need to follow up with OB after discharge to determine what blood glucose control she might need.   Smith Mince RN BSN CDE Diabetes Coordinator Pager: [phone]  8am-5pm

## 2021-02-22 MED ORDER — IBUPROFEN 600 MG PO TABS: 600.0000 mg | ORAL_TABLET | Freq: Four times a day (QID) | ORAL | 0 refills | Status: AC

## 2021-02-22 MED ORDER — ACETAMINOPHEN 325 MG PO TABS: 650.0000 mg | ORAL_TABLET | ORAL | 1 refills | Status: AC | PRN

## 2021-02-22 MED ORDER — OXYCODONE HCL 5 MG PO TABS: 5.0000 mg | ORAL_TABLET | Freq: Four times a day (QID) | ORAL | 0 refills | Status: AC | PRN

## 2021-02-22 NOTE — Lactation Note (Signed)
This note was copied from a baby's chart. Lactation Consultation Note  Patient Name: Girl Raechal Raben IFOYD'X Date: 02/22/2021 Reason for consult: Follow-up assessment Age:34 hours  LC in to room prior to discharge.  Baby is sleeping upon arrival. Mother reports donor milk feeding mostly. Parents state a family member is sharing frozen breast milk with them. Mother explains she will continue pumping and give baby collected amount first. Parents are pace bottle-feeding and have volume guidelines per age. Discussed normal behavior and patterns after 24h, voids and stools as signs good intake, pumping, clusterfeeding, skin to skin. Talked about milk coming into volume and managing engorgement.   Plan: 1-Feeding on demand or 8-12 times in 24h period. 2-Hand express/pump as needed for supplementation 3-Encouraged maternal rest, hydration and food intake.   Contact LC as needed for feeds/support/concerns/questions. All questions answered at this time. Reviewed LC brochure.     Feeding Mother's Current Feeding Choice: Breast Milk and Donor Milk Nipple Type: Slow - flow  Lactation Tools Discussed/Used Tools: Pump Breast pump type: Double-Electric Breast Pump;Manual Pumping frequency: q3  Interventions Interventions: Breast feeding basics reviewed;Skin to skin;DEBP;Hand pump;Expressed milk;Education;LC Services brochure;Pace feeding  Discharge Discharge Education: Engorgement and breast care;Warning signs for feeding baby Pump: Personal;DEBP;Manual  Consult Status Consult Status: Complete Date: 02/22/21 Follow-up type: Call as needed    Tiarah Shisler A Higuera Ancidey 02/22/2021, 2:54 PM

## 2021-02-22 NOTE — Discharge Summary (Signed)
OB Discharge Summary  Patient Name: Paula Herring DOB: Mar 12, 1986 MRN: [id number]  Date of admission: 02/19/2021 Delivering provider: MODY, VAISHALI   Admitting diagnosis: Encounter for induction of labor [Z34.90] Delivery by emergency cesarean [O99.892] Intrauterine pregnancy: [redacted]w[redacted]d     Secondary diagnosis: Patient Active Problem List   Diagnosis Date Noted   PUPPP (pruritic urticarial papules and plaques of pregnancy) 02/20/2021   Postpartum care following cesarean delivery 10/21 02/20/2021   Cesarean delivery - arrest of descent 02/20/2021   Delivery by emergency cesarean 02/20/2021   Encounter for induction of labor 02/19/2021   GDM, class A2 12/22/2020    Date of discharge: 02/22/2021   Discharge diagnosis: Principal Problem:   Postpartum care following cesarean delivery 10/21 Active Problems:   GDM, class A2   Encounter for induction of labor   PUPPP (pruritic urticarial papules and plaques of pregnancy)   Cesarean delivery - arrest of descent   Delivery by emergency cesarean                                                            Post partum procedures: None  Augmentation: AROM, Pitocin, Cytotec, and IP Foley Pain control: Epidural  Laceration:None  Episiotomy:None  Complications: None  Hospital course:  Induction of Labor With Cesarean Section   35 y.o. yo G1P1001 at [redacted]w[redacted]d was admitted to the hospital 02/19/2021 for induction of labor. The patient went for cesarean section due to Arrest of Descent. Delivery details are as follows: Membrane Rupture Time/Date: 2:35 PM ,02/19/2021   Delivery Method:C-Section, Low Transverse  Details of operation can be found in separate operative Note.  Patient had an uncomplicated postpartum course. She is ambulating, tolerating a regular diet, passing flatus, and urinating well.  Patient is discharged home in stable condition on 02/22/21.      Newborn Data: Birth date:02/20/2021  Birth time:1:30 AM  Gender:Female  Living  status:Living  Apgars:9 ,9  Weight:3541 g                                Physical exam  Vitals:   02/21/21 0509 02/21/21 1517 02/21/21 2200 02/22/21 0539  BP: 119/76 108/77 103/64 114/77  Pulse: 79 76 76 (!) 53  Resp: 19 18 19 19   Temp: 97.8 F (36.6 C) 97.7 F (36.5 C) 97.8 F (36.6 C) 97.8 F (36.6 C)  TempSrc: Oral Oral Oral Oral  SpO2: 99% 99% 99% 100%  Weight:      Height:       General: alert, cooperative, and no distress Lochia: appropriate Uterine Fundus: firm Incision: Healing well with no significant drainage, Dressing is clean, dry, and intact DVT Evaluation: No evidence of DVT seen on physical exam.  Labs: Lab Results  Component Value Date   WBC 16.5 (H) 02/20/2021   HGB 11.9 (L) 02/20/2021   HCT 35.7 (L) 02/20/2021   MCV 86.2 02/20/2021   PLT 183 02/20/2021   No flowsheet data found. Edinburgh Postnatal Depression Scale Screening Tool 02/22/2021 02/21/2021 02/20/2021  I have been able to laugh and see the funny side of things. 0 (No Data) (No Data)  I have looked forward with enjoyment to things. 0 - -  I have blamed myself unnecessarily when things went wrong. 0 - -  I have been anxious or worried for no good reason. 0 - -  I have felt scared or panicky for no good reason. 0 - -  Things have been getting on top of me. 0 - -  I have been so unhappy that I have had difficulty sleeping. 0 - -  I have felt sad or miserable. 1 - -  I have been so unhappy that I have been crying. 1 - -  The thought of harming myself has occurred to me. 0 - -  Edinburgh Postnatal Depression Scale Total 2 - -   Vaccines: TDaP UTD         COVID-19   UTD  Discharge instructions:  per After Visit Summary  After Visit Meds:  Allergies as of 02/22/2021   No Known Allergies      Medication List     TAKE these medications    acetaminophen 325 MG tablet Commonly known as: TYLENOL Take 2 tablets (650 mg total) by mouth every 4 (four) hours as needed for mild pain  (temperature > 101.5.).   ibuprofen 600 MG tablet Commonly known as: ADVIL Take 1 tablet (600 mg total) by mouth every 6 (six) hours.   oxyCODONE 5 MG immediate release tablet Commonly known as: Oxy IR/ROXICODONE Take 1 tablet (5 mg total) by mouth every 6 (six) hours as needed for moderate pain.       Diet: routine diet  Activity: Advance as tolerated. Pelvic rest for 6 weeks.   Newborn Data: Live born female  Birth Weight: 7 lb 12.9 oz (3541 g) APGAR: 9, 9  Newborn Delivery   Birth date/time: 02/20/2021 01:30:00 Delivery type: C-Section, Low Transverse Trial of labor: Yes C-section categorization: Primary      Named Gardiner Rhyme Baby Feeding: Bottle and Breast Disposition:home with mother  Delivery Report:  Review the Delivery Report for details.    Follow up:  Follow-up Information     Shea Evans, MD. Schedule an appointment as soon as possible for a visit in 6 week(s).   Specialty: Obstetrics and Gynecology Contact information: [address] [place] [address] [phone]                Clancy Gourd, MSN 02/22/2021, 12:40 PM

## 2021-03-03 ENCOUNTER — Telehealth (HOSPITAL_COMMUNITY): Payer: Self-pay | Admitting: *Deleted

## 2021-03-03 NOTE — Telephone Encounter (Signed)
Patient voiced no questions or concerns regarding her own health. EPDS = 0. Patient voiced no questions or concerns regarding baby at this time. Patient reported infant sleeps in a crib on her back. RN reviewed ABCs of safe sleep - patient verbalized understanding. Patient requested RN email information on hospital's virtual postpartum classes and support groups. Email sent. Deforest Hoyles, RN, 03/03/21, [phone]

## 2022-08-17 IMAGING — US US ABDOMEN LIMITED
1 series · 14 of 25 positions shown · non-contrast
Comparison: None.

CLINICAL DATA: Gallbladder calculus without cholecystitis and no
obstruction

EXAM:
ULTRASOUND ABDOMEN LIMITED RIGHT UPPER QUADRANT

[Series 1: us abdomen limited · 0.19mm/px · 14 of 63 slices shown]
[im 1/63]
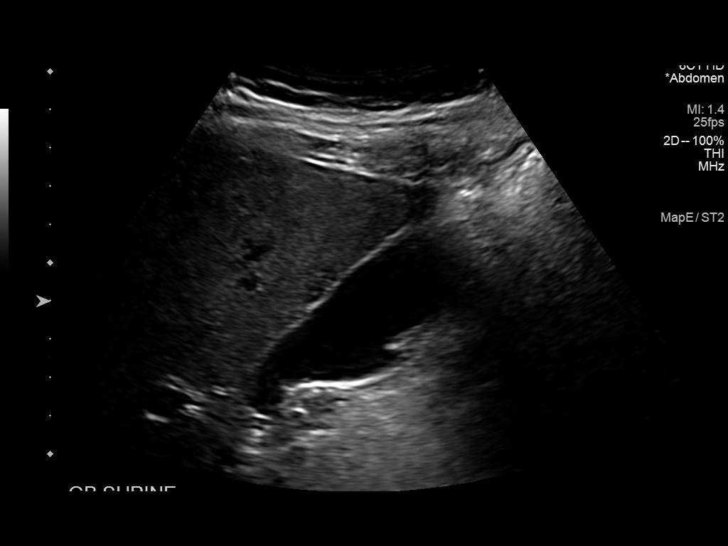
[im 6/63]
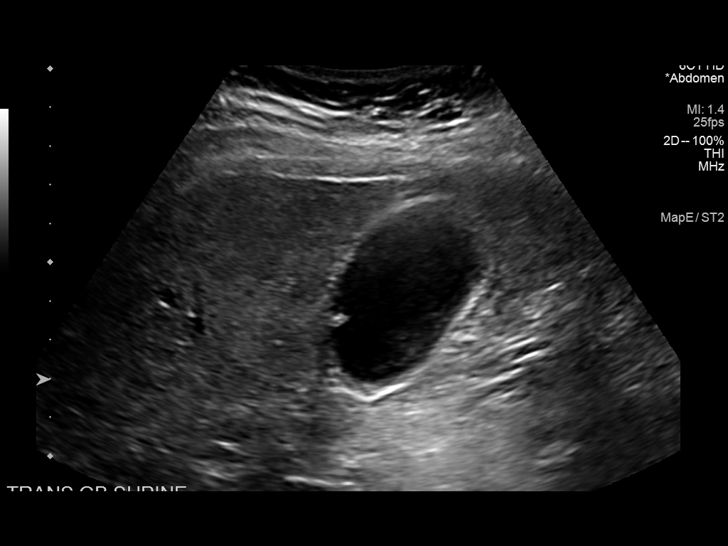
[im 11/63]
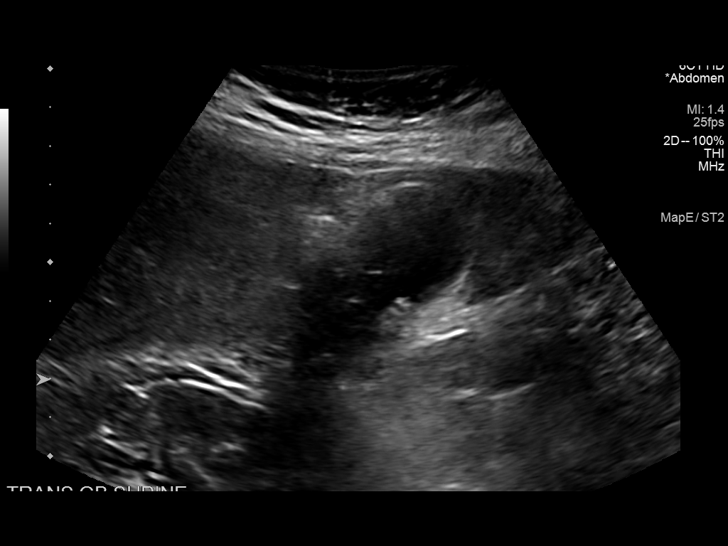
[im 16/63]
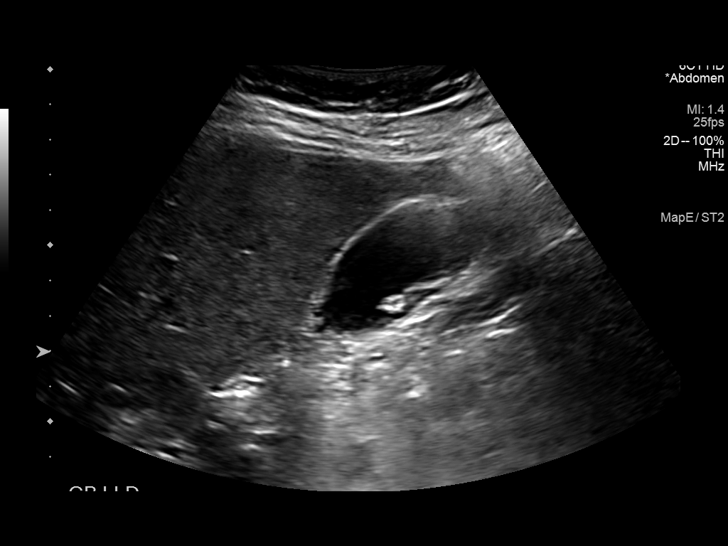
[im 21/63]
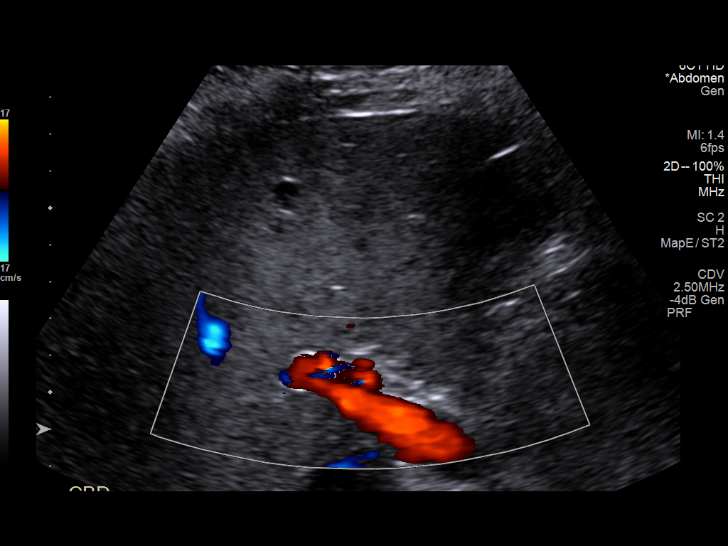
[im 24/63]
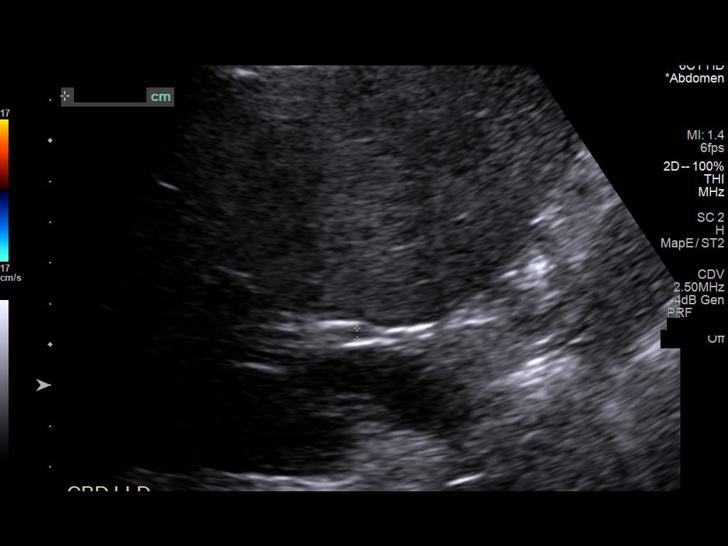
[im 29/63]
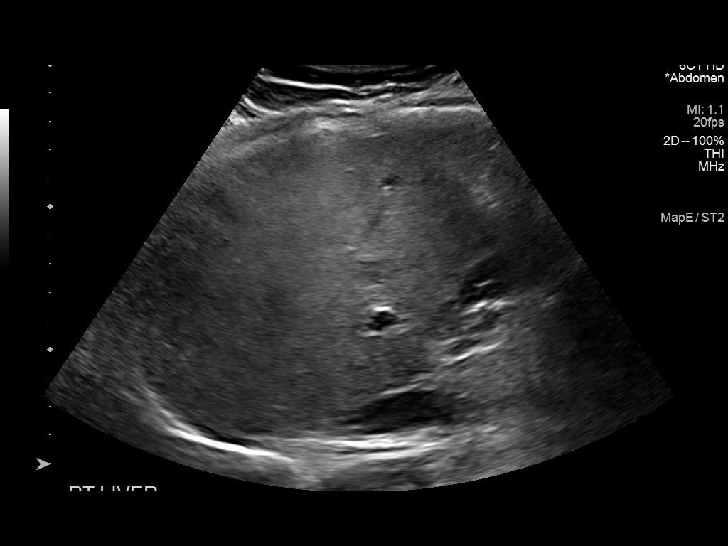
[im 34/63]
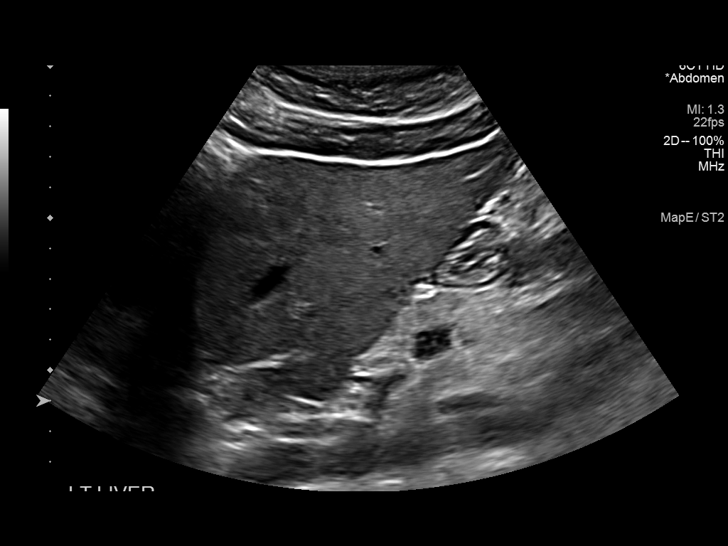
[im 39/63]
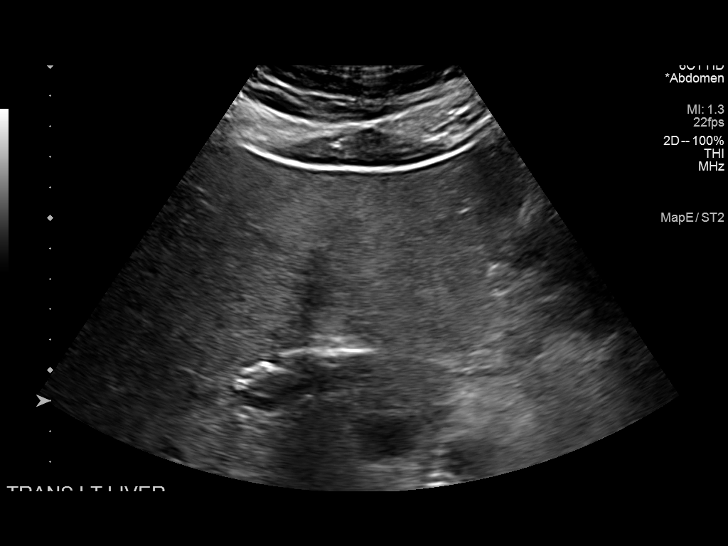
[im 42/63]
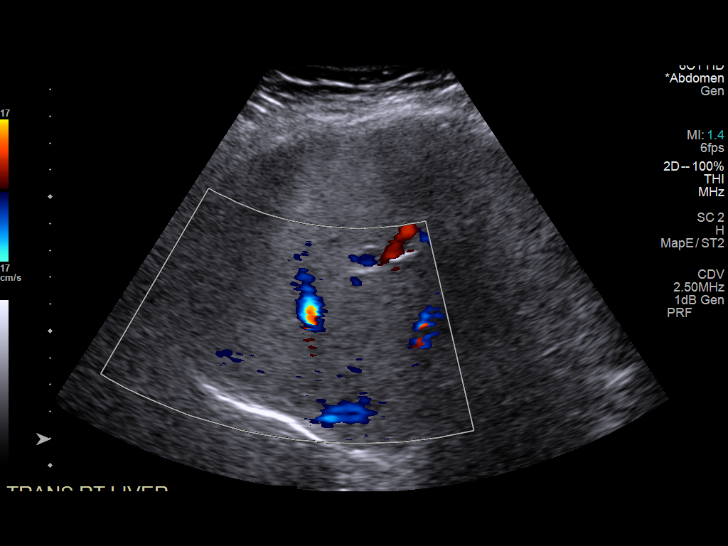
[im 47/63]
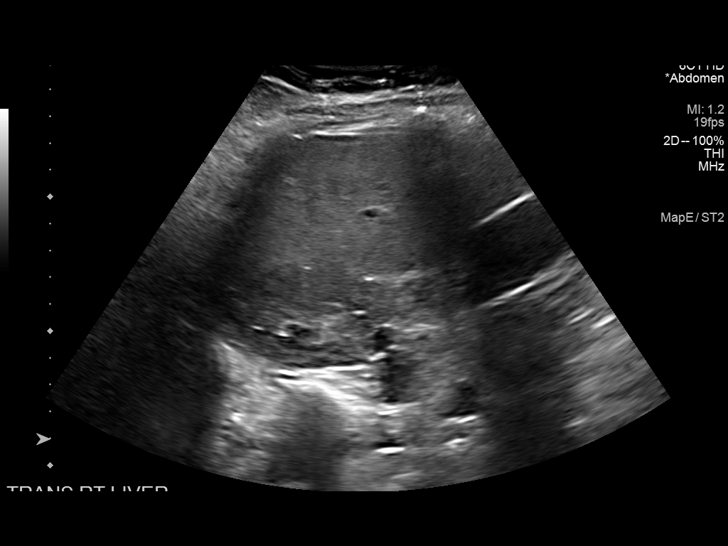
[im 52/63]
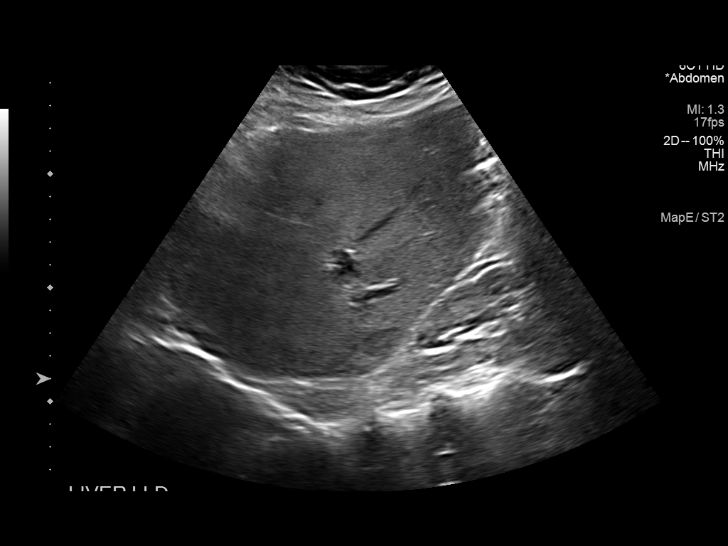
[im 57/63]
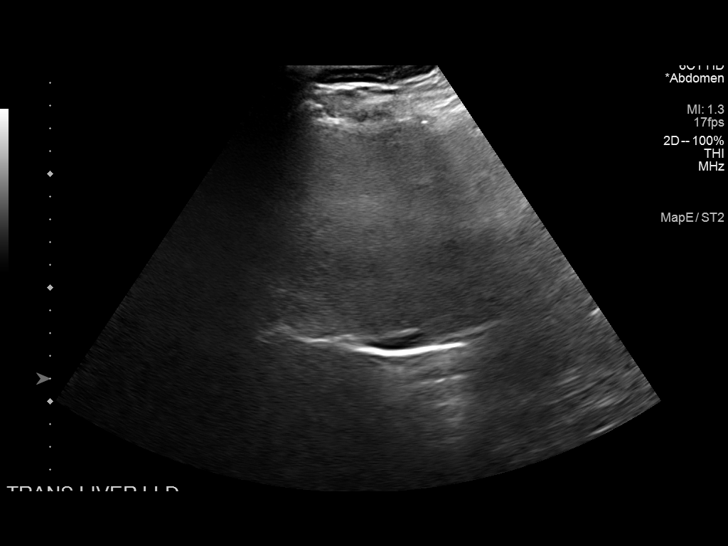
[im 63/63]
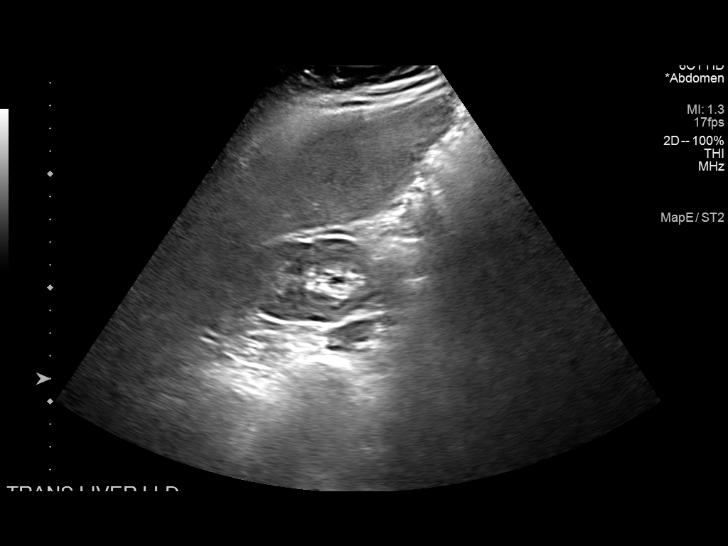

[14 of 25 positions shown; findings below may reference images not displayed]

FINDINGS: Gallbladder:

Gallbladder is nondilated. There are a few adherent hyperechoic foci
to the gallbladder wall without any shadowing, these are non mobile
and largest measures 1.1 cm.

Common bile duct:

Diameter: 3 mm

Liver:

No focal lesion identified. Within normal limits in parenchymal
echogenicity. Portal vein is patent on color Doppler imaging with
normal direction of blood flow towards the liver.

Other: None.
IMPRESSION: Few adherent hyperechoic foci to the gallbladder wall which are
nonshadowing and non mobile, largest measuring 1.1 cm. These are
favored represent small foci of sludge but could be gallbladder
polyps. Recommend follow-up ultrasound in 6 months.
